# Patient Record
Sex: Male | Born: 1965 | Race: Asian | Hispanic: No | Marital: Married | Smoking: Never smoker
Health system: Southern US, Community
[De-identification: ages and names within clinical notes are randomized; demographics above are authoritative.]

---

## 2015-06-26 ENCOUNTER — Ambulatory Visit (INDEPENDENT_AMBULATORY_CARE_PROVIDER_SITE_OTHER): Payer: BLUE CROSS/BLUE SHIELD | Admitting: Family Medicine

## 2015-06-26 VITALS — BP 120/84 | HR 115 | Temp 99.1°F | Resp 20 | Ht 67.0 in | Wt 186.5 lb

## 2015-06-26 DIAGNOSIS — J111 Influenza due to unidentified influenza virus with other respiratory manifestations: Secondary | ICD-10-CM | POA: Diagnosis not present

## 2015-06-26 DIAGNOSIS — R0789 Other chest pain: Secondary | ICD-10-CM

## 2015-06-26 DIAGNOSIS — R590 Localized enlarged lymph nodes: Secondary | ICD-10-CM

## 2015-06-26 LAB — POCT CBC
GRANULOCYTE PERCENT: 68.9 % (ref 37–80)
HCT, POC: 39.9 % — AB (ref 43.5–53.7)
HEMOGLOBIN: 13.9 g/dL — AB (ref 14.1–18.1)
Lymph, poc: 1.1 (ref 0.6–3.4)
MCH, POC: 27.9 pg (ref 27–31.2)
MCHC: 34.9 g/dL (ref 31.8–35.4)
MCV: 80 fL (ref 80–97)
MID (cbc): 0.1 (ref 0–0.9)
MPV: 6.8 fL (ref 0–99.8)
PLATELET COUNT, POC: 185 10*3/uL (ref 142–424)
POC Granulocyte: 2.7 (ref 2–6.9)
POC LYMPH %: 28.3 % (ref 10–50)
POC MID %: 2.8 %M (ref 0–12)
RBC: 4.99 M/uL (ref 4.69–6.13)
RDW, POC: 12.4 %
WBC: 3.9 10*3/uL — AB (ref 4.6–10.2)

## 2015-06-26 MED ORDER — ALBUTEROL SULFATE (2.5 MG/3ML) 0.083% IN NEBU
2.5000 mg | INHALATION_SOLUTION | Freq: Once | RESPIRATORY_TRACT | Status: AC
  Administered 2015-06-26: 2.5 mg via RESPIRATORY_TRACT

## 2015-06-26 MED ORDER — HYDROCOD POLST-CPM POLST ER 10-8 MG/5ML PO SUER: 5.0000 mL | Freq: Two times a day (BID) | ORAL | Status: DC | PRN

## 2015-06-26 NOTE — Progress Notes (Signed)
Subjective:    Patient ID: Tony Rogers, male    DOB: 1966/02/16, 50 y.o.   MRN: [id number] By signing my name below, I, Javier Docker, attest that this documentation has been prepared under the direction and in the presence of Norberto Sorenson, MD. Electronically Signed: Javier Docker, ER Scribe. 06/26/2015. 12:47 PM.  Chief Complaint  Patient presents with  . Chills  . Nasal Congestion  . Cough   HPI HPI Comments: Tony Rogers is a 50 y.o. male who presents to [facility] complaining of diaphoresis, fever, chills, fatigue, sore throat, rhinorrhea, cough productive of white sputum for the last four days. His cough keeps him up at night. He used dayquil at home without significant relief. He got a flu shot this year. He endorses some SOB. He has never used an inhaler in the past. He has no past allergy hx.    History reviewed. No pertinent past medical history.  No Known Allergies  No current outpatient prescriptions on file prior to visit.   No current facility-administered medications on file prior to visit.    Review of Systems  Constitutional: Positive for fever, chills, diaphoresis, activity change, appetite change and fatigue. Negative for unexpected weight change.  HENT: Positive for congestion, postnasal drip and rhinorrhea. Negative for ear pain, sinus pressure, sore throat, trouble swallowing and voice change.   Respiratory: Positive for cough and chest tightness. Negative for shortness of breath and wheezing.   Cardiovascular: Negative for chest pain.  Gastrointestinal: Positive for nausea. Negative for vomiting, abdominal pain, diarrhea and constipation.  Genitourinary: Negative for dysuria.  Musculoskeletal: Positive for myalgias, back pain and arthralgias. Negative for joint swelling, gait problem and neck stiffness.  Skin: Negative for rash.  Allergic/Immunologic: Negative for immunocompromised state.  Neurological: Positive for headaches. Negative for syncope.  Hematological:  Positive for adenopathy.  Psychiatric/Behavioral: Positive for sleep disturbance.       Objective:  BP 120/84 mmHg  Pulse 115  Temp(Src) 99.1 F (37.3 C) (Oral)  Resp 20  Ht  (1.702 m)  Wt 186 lb 8 oz (84.596 kg)  BMI 29.20 kg/m2  SpO2 96%  Physical Exam  Constitutional: He is oriented to person, place, and time. He appears well-developed and well-nourished. No distress.  HENT:  Head: Normocephalic and atraumatic.  TMs normal. Nasal erythema and friability. Oropharynx with erythema.   Eyes: Pupils are equal, round, and reactive to light.  Neck: Neck supple.  Firm submandibular and tonsillar lymph nodes. Positive anterior cervical adenopathy. Diaphoretic.   Cardiovascular: Regular rhythm and normal heart sounds.   No murmur heard. Tachycardic.  Pulmonary/Chest: Effort normal and breath sounds normal. No respiratory distress. He has no wheezes.  Good air movement. Lungs clear.   Musculoskeletal: Normal range of motion.  Lymphadenopathy:    He has cervical adenopathy.  Neurological: He is alert and oriented to person, place, and time. Coordination normal.  Skin: Skin is warm and dry. He is not diaphoretic.  Psychiatric: He has a normal mood and affect. His behavior is normal.  Nursing note and vitals reviewed.     Results for orders placed or performed in visit on 06/26/15  POCT CBC  Result Value Ref Range   WBC 3.9 (A) 4.6 - 10.2 K/uL   Lymph, poc 1.1 0.6 - 3.4   POC LYMPH PERCENT 28.3 10 - 50 %L   MID (cbc) 0.1 0 - 0.9   POC MID % 2.8 0 - 12 %M   POC Granulocyte 2.7 2 -  6.9   Granulocyte percent 68.9 37 - 80 %G   RBC 4.99 4.69 - 6.13 M/uL   Hemoglobin 13.9 (A) 14.1 - 18.1 g/dL   HCT, POC 16.139.9 (A) 09.643.5 - 53.7 %   MCV 80.0 80 - 97 fL   MCH, POC 27.9 27 - 31.2 pg   MCHC 34.9 31.8 - 35.4 g/dL   RDW, POC 04.512.4 %   Platelet Count, POC 185 142 - 424 K/uL   MPV 6.8 0 - 99.8 fL    Assessment & Plan:   1. Influenza   2. Cervical adenopathy - RTC in 2 wks for  recheck if not completely resolved.  3. Chest tightness - just with coughing - more c/o chest cong rather than tightness. RTC immed if worsens. No improvement in sxs after albuterol neb.    Orders Placed This Encounter  Procedures  . POCT CBC    Meds ordered this encounter  Medications  . albuterol (PROVENTIL) (2.5 MG/3ML) 0.083% nebulizer solution 2.5 mg    Sig:   . chlorpheniramine-HYDROcodone (TUSSIONEX PENNKINETIC ER) 10-8 MG/5ML SUER    Sig: Take 5 mLs by mouth every 12 (twelve) hours as needed.    Dispense:  120 mL    Refill:  0    I personally performed the services described in this documentation, which was scribed in my presence. The recorded information has been reviewed and considered, and addended by me as needed.  Norberto SorensonEva Yancey Pedley, MD MPH

## 2015-06-26 NOTE — Patient Instructions (Addendum)
   IF you received an x-ray today, you will receive an invoice from [facility]. Please contact [facility] at [phone] with questions or concerns regarding your invoice.   IF you received labwork today, you will receive an invoice from [facility]. Please contact [facility] at [phone] with questions or concerns regarding your invoice.   Our billing staff will not be able to assist you with questions regarding bills from these companies.  You will be contacted with the lab results as soon as they are available. The fastest way to get your results is to activate your My Chart account. Instructions are located on the last page of this paperwork. If you have not heard from us regarding the results in 2 weeks, please contact this office.     Influenza, Adult Influenza ("the flu") is a viral infection of the respiratory tract. It occurs more often in winter months because people spend more time in close contact with one another. Influenza can make you feel very sick. Influenza easily spreads from person to person (contagious). CAUSES  Influenza is caused by a virus that infects the respiratory tract. You can catch the virus by breathing in droplets from an infected person's cough or sneeze. You can also catch the virus by touching something that was recently contaminated with the virus and then touching your mouth, nose, or eyes. RISKS AND COMPLICATIONS You may be at risk for a more severe case of influenza if you smoke cigarettes, have diabetes, have chronic heart disease (such as heart failure) or lung disease (such as asthma), or if you have a weakened immune system. Elderly people and pregnant women are also at risk for more serious infections. The most common problem of influenza is a lung infection (pneumonia). Sometimes, this problem can require emergency medical care and may be life threatening. SIGNS AND SYMPTOMS  Symptoms typically last 4  to 10 days and may include:  Fever.  Chills.  Headache, body aches, and muscle aches.  Sore throat.  Chest discomfort and cough.  Poor appetite.  Weakness or feeling tired.  Dizziness.  Nausea or vomiting. DIAGNOSIS  Diagnosis of influenza is often made based on your history and a physical exam. A nose or throat swab test can be done to confirm the diagnosis. TREATMENT  In mild cases, influenza goes away on its own. Treatment is directed at relieving symptoms. For more severe cases, your health care provider may prescribe antiviral medicines to shorten the sickness. Antibiotic medicines are not effective because the infection is caused by a virus, not by bacteria. HOME CARE INSTRUCTIONS  Take medicines only as directed by your health care provider.  Use a cool mist humidifier to make breathing easier.  Get plenty of rest until your temperature returns to normal. This usually takes 3 to 4 days.  Drink enough fluid to keep your urine clear or pale yellow.  Cover yourmouth and nosewhen coughing or sneezing,and wash your handswellto prevent thevirusfrom spreading.  Stay homefromwork orschool untilthe fever is gonefor at least 1full day. PREVENTION  An annual influenza vaccination (flu shot) is the best way to avoid getting influenza. An annual flu shot is now routinely recommended for all adults in the [place] SEEK MEDICAL CARE IF:  You experiencechest pain, yourcough worsens,or you producemore mucus.  Youhave nausea,vomiting, ordiarrhea.  Your fever returns or gets worse. SEEK IMMEDIATE MEDICAL CARE IF:  You havetrouble breathing, you become short of breath,or your skin ornails becomebluish.  You have severe painor   stiffnessin the neck.  You develop a sudden headache, or pain in the face or ear.  You have nausea or vomiting that you cannot control. MAKE SURE YOU:   Understand these instructions.  Will watch your condition.  Will get help  right away if you are not doing well or get worse.   This information is not intended to replace advice given to you by your health care provider. Make sure you discuss any questions you have with your health care provider.   Document Released: 03/09/2000 Document Revised: 04/02/2014 Document Reviewed: 06/11/2011 Elsevier Interactive Patient Education 2016 Elsevier Inc.  

## 2016-03-05 ENCOUNTER — Ambulatory Visit (INDEPENDENT_AMBULATORY_CARE_PROVIDER_SITE_OTHER): Payer: BLUE CROSS/BLUE SHIELD

## 2016-03-05 ENCOUNTER — Ambulatory Visit (INDEPENDENT_AMBULATORY_CARE_PROVIDER_SITE_OTHER): Payer: BLUE CROSS/BLUE SHIELD | Admitting: Family Medicine

## 2016-03-05 VITALS — BP 114/74 | HR 116 | Temp 99.5°F | Resp 17 | Ht 67.0 in | Wt 186.0 lb

## 2016-03-05 DIAGNOSIS — R5081 Fever presenting with conditions classified elsewhere: Secondary | ICD-10-CM | POA: Diagnosis not present

## 2016-03-05 DIAGNOSIS — R05 Cough: Secondary | ICD-10-CM

## 2016-03-05 DIAGNOSIS — J029 Acute pharyngitis, unspecified: Secondary | ICD-10-CM | POA: Diagnosis not present

## 2016-03-05 DIAGNOSIS — R059 Cough, unspecified: Secondary | ICD-10-CM

## 2016-03-05 LAB — POCT INFLUENZA A/B
INFLUENZA A, POC: NEGATIVE
INFLUENZA B, POC: NEGATIVE

## 2016-03-05 LAB — POCT RAPID STREP A (OFFICE): RAPID STREP A SCREEN: NEGATIVE

## 2016-03-05 MED ORDER — HYDROCODONE-HOMATROPINE 5-1.5 MG/5ML PO SYRP: ORAL_SOLUTION | ORAL | 0 refills | Status: AC

## 2016-03-05 NOTE — Patient Instructions (Addendum)
Chest xray ok. Flu test and strep tests were normal, but symptoms could still be due to the flu.  I will check a throat culture, but I suspect symptoms are due to a virus. Drink plenty of fluids, Mucinex if needed for cough, Cepacol lozenges if needed for sore throat, cough syrup at night if needed. If not improving in 2-3 days, or any worsening sooner  - return for recheck.   Return to the clinic or go to the nearest emergency room if any of your symptoms worsen or new symptoms occur.   Cough, Adult Coughing is a reflex that clears your throat and your airways. Coughing helps to heal and protect your lungs. It is normal to cough occasionally, but a cough that happens with other symptoms or lasts a long time may be a sign of a condition that needs treatment. A cough may last only 2-3 weeks (acute), or it may last longer than 8 weeks (chronic). What are the causes? Coughing is commonly caused by:  Breathing in substances that irritate your lungs.  A viral or bacterial respiratory infection.  Allergies.  Asthma.  Postnasal drip.  Smoking.  Acid backing up from the stomach into the esophagus (gastroesophageal reflux).  Certain medicines.  Chronic lung problems, including COPD (or rarely, lung cancer).  Other medical conditions such as heart failure. Follow these instructions at home: Pay attention to any changes in your symptoms. Take these actions to help with your discomfort:  Take medicines only as told by your health care provider.  If you were prescribed an antibiotic medicine, take it as told by your health care provider. Do not stop taking the antibiotic even if you start to feel better.  Talk with your health care provider before you take a cough suppressant medicine.  Drink enough fluid to keep your urine clear or pale yellow.  If the air is dry, use a cold steam vaporizer or humidifier in your bedroom or your home to help loosen secretions.  Avoid anything that causes  you to cough at work or at home.  If your cough is worse at night, try sleeping in a semi-upright position.  Avoid cigarette smoke. If you smoke, quit smoking. If you need help quitting, ask your health care provider.  Avoid caffeine.  Avoid alcohol.  Rest as needed. Contact a health care provider if:  You have new symptoms.  You cough up pus.  Your cough does not get better after 2-3 weeks, or your cough gets worse.  You cannot control your cough with suppressant medicines and you are losing sleep.  You develop pain that is getting worse or pain that is not controlled with pain medicines.  You have a fever.  You have unexplained weight loss.  You have night sweats. Get help right away if:  You cough up blood.  You have difficulty breathing.  Your heartbeat is very fast. This information is not intended to replace advice given to you by your health care provider. Make sure you discuss any questions you have with your health care provider. Document Released: 09/08/2010 Document Revised: 08/18/2015 Document Reviewed: 05/19/2014 Elsevier Interactive Patient Education  2017 [employer]   IF you received an x-ray today, you will receive an invoice from [facility]. Please contact [facility] at [phone] with questions or concerns regarding your invoice.   IF you received labwork today, you will receive an invoice from [facility]. Please contact [facility] at [phone] with questions or concerns regarding your  invoice.   Our billing staff will not be able to assist you with questions regarding bills from these companies.  You will be contacted with the lab results as soon as they are available. The fastest way to get your results is to activate your My Chart account. Instructions are located on the last page of this paperwork. If you have not heard from [place] regarding the results in 2 weeks, please contact this office.

## 2016-03-05 NOTE — Progress Notes (Signed)
Subjective:  By signing my name below, I, Tony Rogers, attest that this documentation has been prepared under the direction and in the presence of Tony StaggersJeffrey Zyair Russi, MD.  Electronically Signed: Andrew Auaven Rogers, ED Scribe. 03/05/2016. 2:40 PM.   Patient ID: Tony Rogers, male    DOB: Sep 12, 1965, 50 y.o.   MRN: [id number]  HPI   Chief Complaint  Patient presents with  . Cough    Productive, x3days. sore throat     HPI Comments: Tony Rogers is a 50 y.o. male who presents to the [facility] complaining of productive cough onset 3 days. Pt reports associated chills, fatigue, and sore throat. Pt tried Dayquil, nyquil and advil without relief. 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  Pt has had flu shot this year.    There are no active problems to display for this patient.  No past medical history on file. No past surgical history on file. No Known Allergies Prior to Admission medications   Medication Sig Start Date End Date Taking? Authorizing Provider  DM-Doxylamine-Acetaminophen (NYQUIL COLD & FLU PO) Take by mouth.   Yes Historical Provider, MD   Social History   Social History  . Marital status: Married    Spouse name: N/A  . Number of children: N/A  . Years of education: N/A   Occupational History  . Not on file.   Social History Main Topics  . Smoking status: Never Smoker  . Smokeless tobacco: Never Used  . Alcohol use No  . Drug use: No  . Sexual activity: Not on file   Other Topics Concern  . Not on file   Social History Narrative  . No narrative on file   Review of Systems  Constitutional: Positive for chills and fatigue. Negative for fever.  HENT: Positive for sore throat.   Respiratory: Positive for cough.        Objective:   Physical Exam  Constitutional: He is oriented to person, place, and time. He appears well-developed and well-nourished. No distress.  HENT:  Head: Normocephalic and atraumatic.  Mouth/Throat:  Posterior oropharyngeal erythema ( minimal) present. No oropharyngeal exudate.  Eyes: Conjunctivae and EOM are normal.  Neck: Neck supple.  Cardiovascular: Regular rhythm.  Tachycardia present.   Pulmonary/Chest: Effort normal.  Few coarse breath sounds left lower lobe  Musculoskeletal: Normal range of motion.  Lymphadenopathy:    He has no cervical adenopathy.  Neurological: He is alert and oriented to person, place, and time.  Skin: Skin is warm and dry.  Psychiatric: He has a normal mood and affect. His behavior is normal.  Nursing note and vitals reviewed.   Vitals:   03/05/16 1433  BP: 114/74  Pulse: (!) 116  Resp: 17  Temp: 99.5 F (37.5 C)  TempSrc: Oral  SpO2: 96%  Weight: 186 lb (84.4 kg)  Height: 5\' 7"  (1.702 m)    Results for orders placed or performed in visit on 03/05/16  POCT Influenza A/B  Result Value Ref Range   Influenza A, POC Negative Negative   Influenza B, POC Negative Negative  POCT rapid strep A  Result Value Ref Range   Rapid Strep A Screen Negative Negative   Dg Chest 2 View  Result Date: 03/05/2016 CLINICAL DATA:  50 year old with cough and fever. EXAM: CHEST  2 VIEW COMPARISON:  None. FINDINGS: Nodular density in the right lower chest probably represents a nipple shadow based on the lateral view. Lungs are clear  without airspace disease or pulmonary edema. Heart and mediastinum are within normal limits. Mild curvature in the thoracic spine. Mild endplate changes in thoracic spine. No pleural effusions. IMPRESSION: No active cardiopulmonary disease. Electronically Signed   By: Richarda Overlie M.D.   On: 03/05/2016 16:03    Assessment & Plan:    Tony Rogers is a 50 y.o. male Sore throat - Plan: POCT rapid strep A, Culture, Group A Strep Cough - Plan: POCT Influenza A/B, DG Chest 2 View, HYDROcodone-homatropine (HYCODAN) 5-1.5 MG/5ML syrup Fever in other diseases - Plan: DG Chest 2 View  - Low grade temp, cough, sore throat, viral symptoms with  negative flu and strep testing. Possible false-negative flu testing, but beyond 48 hours of symptoms. Chest x-ray without acute infiltrate. Symptomatic care discussed, out of work note given for next 48 hours, Hycodan cough syrup if needed for cough at night, side effects discussed. RTC precautions in 48-72 hours if not improving, sooner if worse. Understanding expressed.  Meds ordered this encounter  Medications  . DM-Doxylamine-Acetaminophen (NYQUIL COLD & FLU PO)    Sig: Take by mouth.  Marland Kitchen HYDROcodone-homatropine (HYCODAN) 5-1.5 MG/5ML syrup    Sig: 53m by mouth a bedtime as needed for cough.    Dispense:  120 mL    Refill:  0   Patient Instructions   Chest xray ok. Flu test and strep tests were normal, but symptoms could still be due to the flu.  I will check a throat culture, but I suspect symptoms are due to a virus. Drink plenty of fluids, Mucinex if needed for cough, Cepacol lozenges if needed for sore throat, cough syrup at night if needed. If not improving in 2-3 days, or any worsening sooner  - return for recheck.   Return to the clinic or go to the nearest emergency room if any of your symptoms worsen or new symptoms occur.   Cough, Adult Coughing is a reflex that clears your throat and your airways. Coughing helps to heal and protect your lungs. It is normal to cough occasionally, but a cough that happens with other symptoms or lasts a long time may be a sign of a condition that needs treatment. A cough may last only 2-3 weeks (acute), or it may last longer than 8 weeks (chronic). What are the causes? Coughing is commonly caused by:  Breathing in substances that irritate your lungs.  A viral or bacterial respiratory infection.  Allergies.  Asthma.  Postnasal drip.  Smoking.  Acid backing up from the stomach into the esophagus (gastroesophageal reflux).  Certain medicines.  Chronic lung problems, including COPD (or rarely, lung cancer).  Other medical conditions  such as heart failure. Follow these instructions at home: Pay attention to any changes in your symptoms. Take these actions to help with your discomfort:  Take medicines only as told by your health care provider.  If you were prescribed an antibiotic medicine, take it as told by your health care provider. Do not stop taking the antibiotic even if you start to feel better.  Talk with your health care provider before you take a cough suppressant medicine.  Drink enough fluid to keep your urine clear or pale yellow.  If the air is dry, use a cold steam vaporizer or humidifier in your bedroom or your home to help loosen secretions.  Avoid anything that causes you to cough at work or at home.  If your cough is worse at night, try sleeping in a semi-upright position.  Avoid cigarette smoke. If you smoke, quit smoking. If you need help quitting, ask your health care provider.  Avoid caffeine.  Avoid alcohol.  Rest as needed. Contact a health care provider if:  You have new symptoms.  You cough up pus.  Your cough does not get better after 2-3 weeks, or your cough gets worse.  You cannot control your cough with suppressant medicines and you are losing sleep.  You develop pain that is getting worse or pain that is not controlled with pain medicines.  You have a fever.  You have unexplained weight loss.  You have night sweats. Get help right away if:  You cough up blood.  You have difficulty breathing.  Your heartbeat is very fast. This information is not intended to replace advice given to you by your health care provider. Make sure you discuss any questions you have with your health care provider. Document Released: 09/08/2010 Document Revised: 08/18/2015 Document Reviewed: 05/19/2014 Elsevier Interactive Patient Education  2017 [employer]   IF you received an x-ray today, you will receive an invoice from [facility]. Please contact [facility] at  [phone] with questions or concerns regarding your invoice.   IF you received labwork today, you will receive an invoice from [facility]. Please contact [facility] at [phone] with questions or concerns regarding your invoice.   Our billing staff will not be able to assist you with questions regarding bills from these companies.  You will be contacted with the lab results as soon as they are available. The fastest way to get your results is to activate your My Chart account. Instructions are located on the last page of this paperwork. If you have not heard from us regarding the results in 2 weeks, please contact this office.        I personally performed the services described in this documentation, which was scribed in my presence. The recorded information has been reviewed and considered, and addended by me as needed.   Signed,   Tony StaggersJeffrey Terrence Pizana, MD [facility].  03/05/16 4:14 PM

## 2016-03-08 LAB — CULTURE, GROUP A STREP: Strep A Culture: NEGATIVE

## 2016-03-16 ENCOUNTER — Encounter: Payer: Self-pay | Admitting: *Deleted

## 2018-04-26 IMAGING — DX DG CHEST 2V
2 series · 2 of 2 positions shown · non-contrast
Comparison: None.

CLINICAL DATA: 50-year-old with cough and fever.

EXAM:
CHEST  2 VIEW

[chest pa]
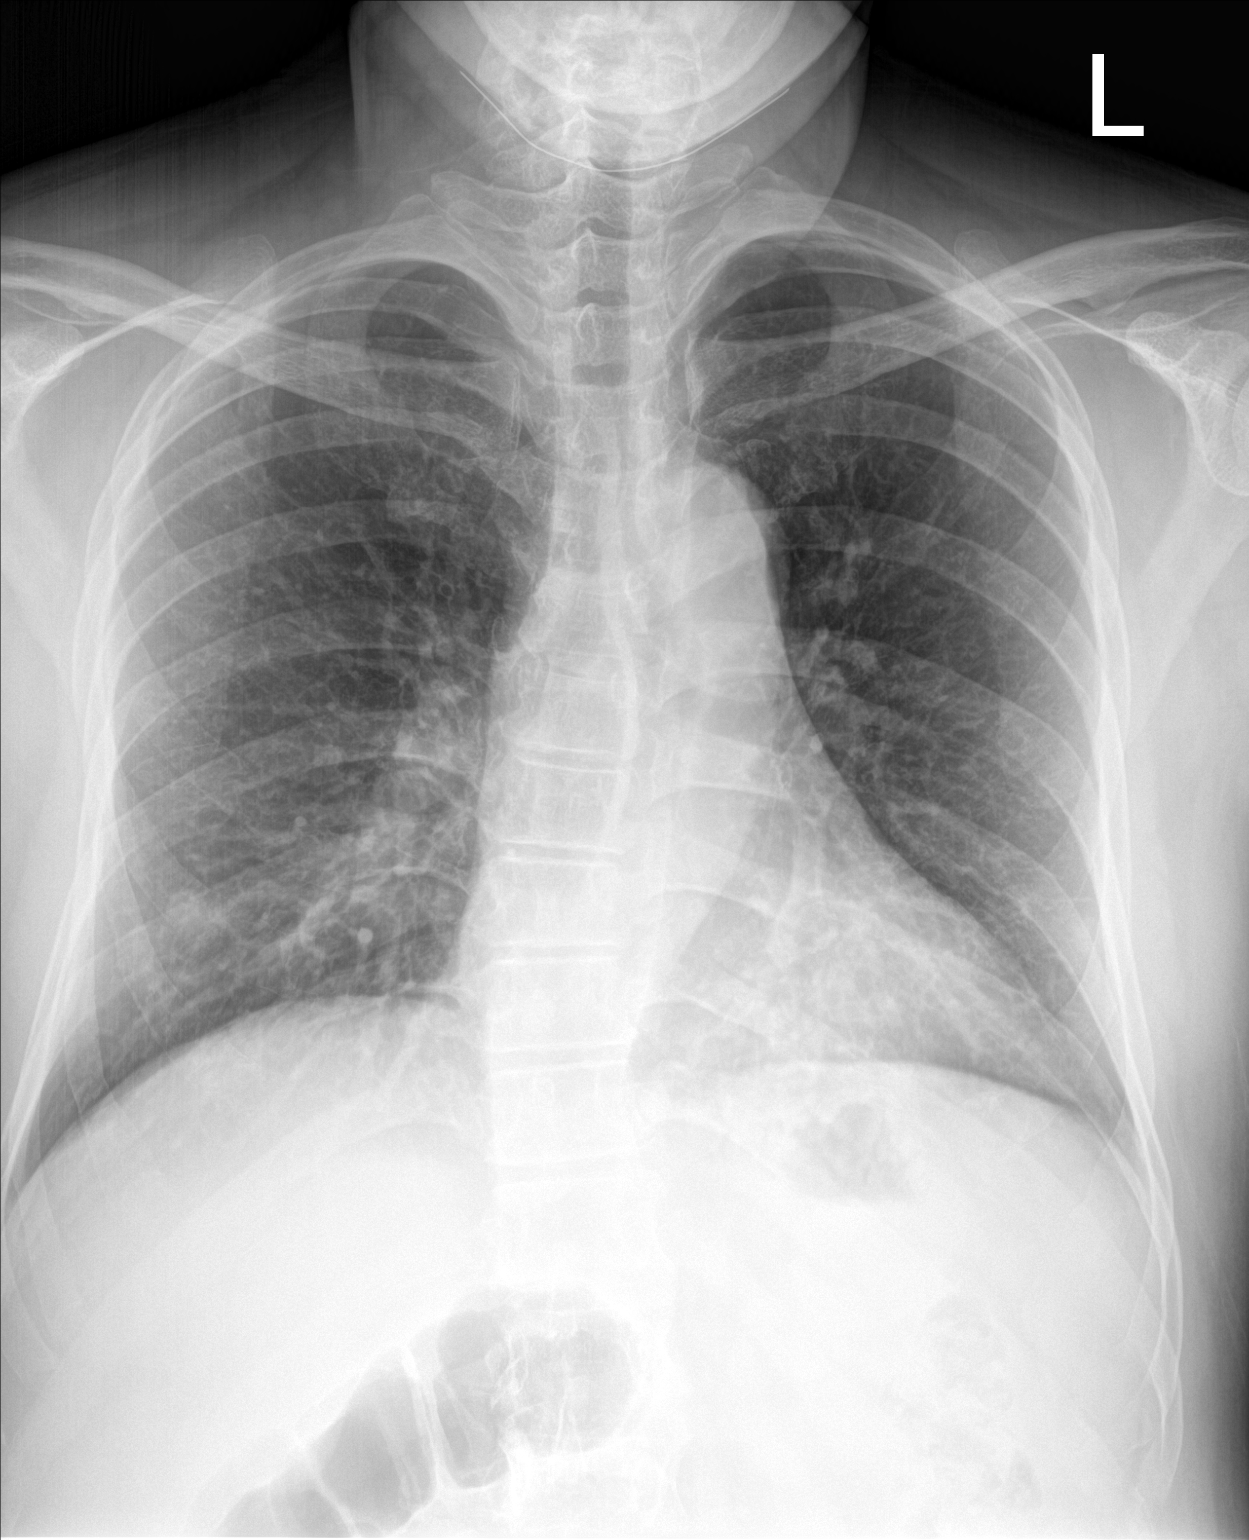

[chest lat]
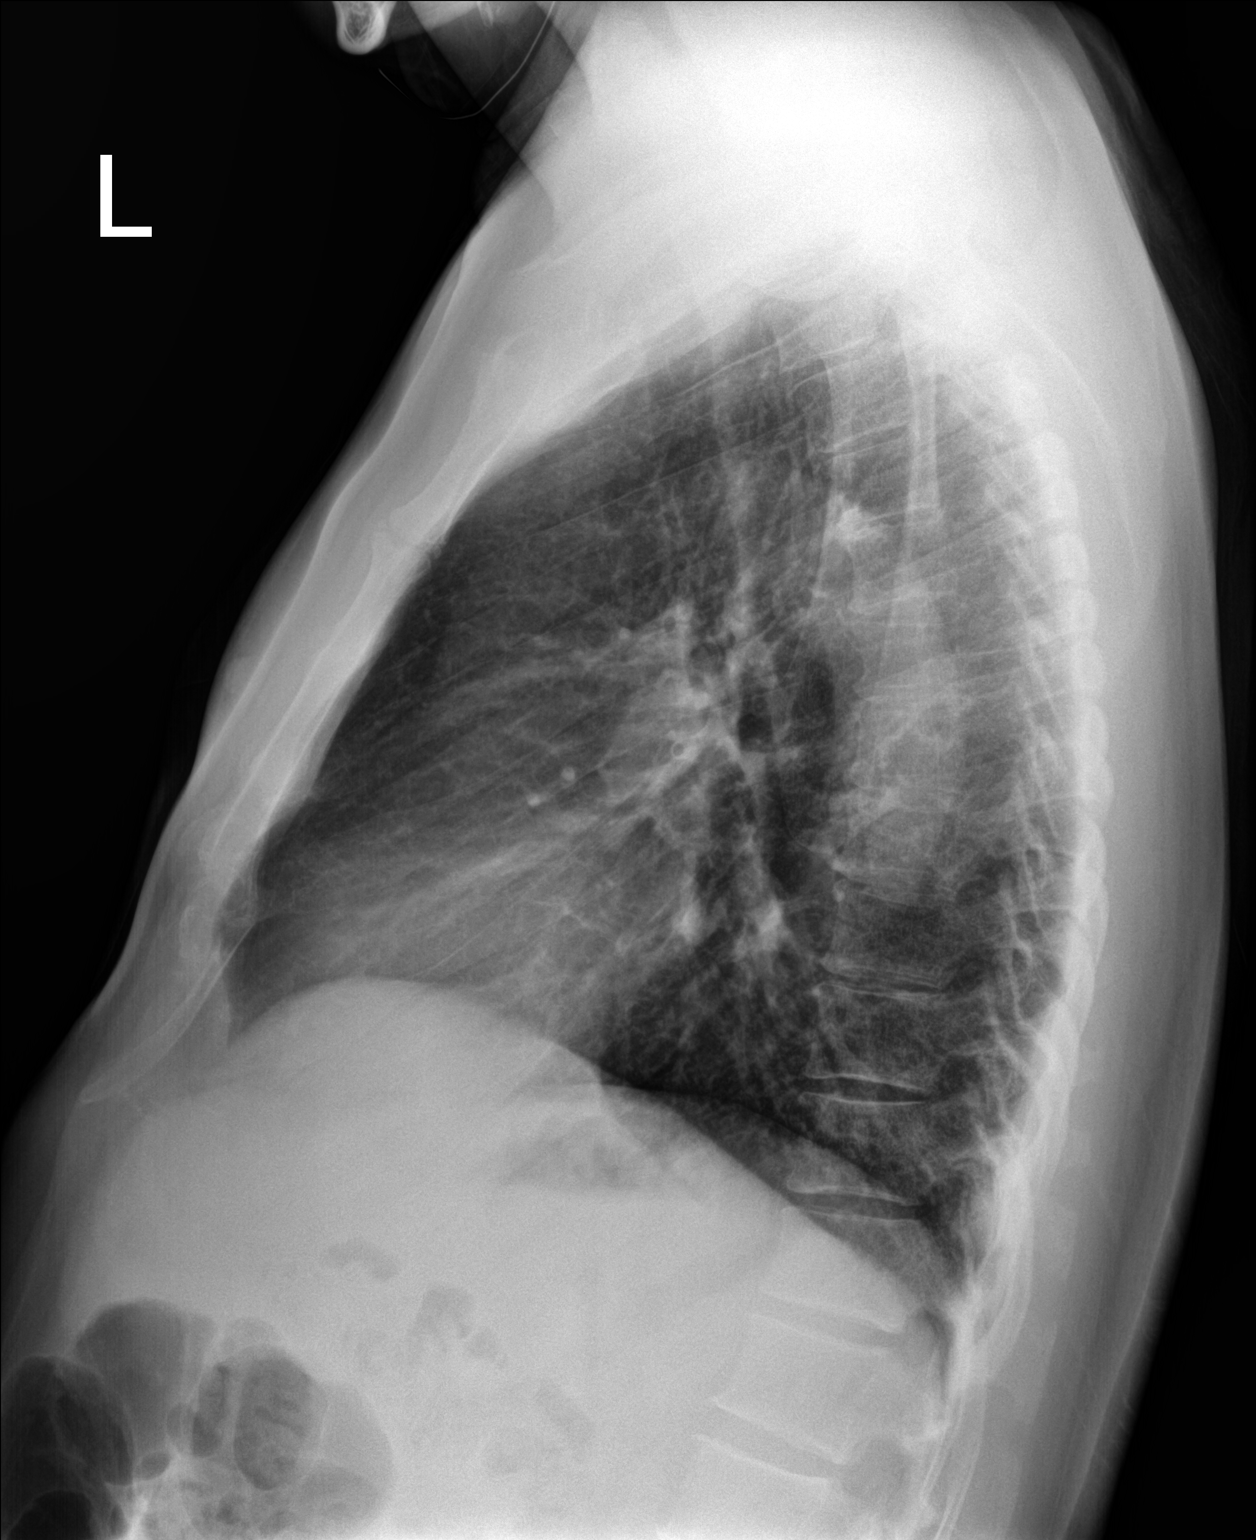

[2 of 2 positions shown; findings below may reference images not displayed]

FINDINGS: Nodular density in the right lower chest probably represents a
nipple shadow based on the lateral view. Lungs are clear without
airspace disease or pulmonary edema. Heart and mediastinum are
within normal limits. Mild curvature in the thoracic spine. Mild
endplate changes in thoracic spine. No pleural effusions.
IMPRESSION: No active cardiopulmonary disease.

## 2019-04-27 DIAGNOSIS — E1169 Type 2 diabetes mellitus with other specified complication: Secondary | ICD-10-CM | POA: Diagnosis not present

## 2019-04-27 DIAGNOSIS — I10 Essential (primary) hypertension: Secondary | ICD-10-CM | POA: Diagnosis not present

## 2019-04-27 DIAGNOSIS — E782 Mixed hyperlipidemia: Secondary | ICD-10-CM | POA: Diagnosis not present

## 2019-05-31 ENCOUNTER — Ambulatory Visit: Payer: Self-pay | Attending: Internal Medicine

## 2019-05-31 DIAGNOSIS — Z23 Encounter for immunization: Secondary | ICD-10-CM | POA: Insufficient documentation

## 2019-05-31 NOTE — Progress Notes (Signed)
   Covid-19 Vaccination Clinic  Name:  Tony Rogers    MRN: [id number] DOB: 1965-08-12  05/31/2019  [name] was observed post Covid-19 immunization for 15 minutes without incident. He was provided with Vaccine Information Sheet and instruction to access the V-Safe system.   [name] was instructed to call 911 with any severe reactions post vaccine: Marland Kitchen Difficulty breathing  . Swelling of face and throat  . A fast heartbeat  . A bad rash all over body  . Dizziness and weakness   Immunizations Administered    Name Date Dose VIS Date Route   Pfizer COVID-19 Vaccine 05/31/2019  5:29 PM 0.3 mL 03/06/2019 Intramuscular   Manufacturer: [employer], [employer]   Lot: [id number]   NDC: [address]-0

## 2019-07-01 ENCOUNTER — Ambulatory Visit: Payer: Self-pay | Attending: Neurology

## 2019-07-01 DIAGNOSIS — Z23 Encounter for immunization: Secondary | ICD-10-CM

## 2019-07-01 NOTE — Progress Notes (Signed)
   Covid-19 Vaccination Clinic  Name:  Tony Rogers    MRN: [id number] DOB: [birth year]  07/01/2019  Tony Rogers was observed post Covid-19 immunization for 15 minutes without incident. He was provided with Vaccine Information Sheet and instruction to access the V-Safe system.   Tony Rogers was instructed to call 911 with any severe reactions post vaccine: Marland Kitchen Difficulty breathing  . Swelling of face and throat  . A fast heartbeat  . A bad rash all over body  . Dizziness and weakness   Immunizations Administered    Name Date Dose VIS Date Route   Pfizer COVID-19 Vaccine 07/01/2019  3:39 PM 0.3 mL 03/06/2019 Intramuscular   Manufacturer: [employer], [employer]   Lot: [id number]   NDC: [address]-7

## 2019-07-13 DIAGNOSIS — Z0000 Encounter for general adult medical examination without abnormal findings: Secondary | ICD-10-CM | POA: Diagnosis not present

## 2020-01-11 DIAGNOSIS — Z23 Encounter for immunization: Secondary | ICD-10-CM | POA: Diagnosis not present

## 2020-01-11 DIAGNOSIS — I10 Essential (primary) hypertension: Secondary | ICD-10-CM | POA: Diagnosis not present

## 2020-01-11 DIAGNOSIS — E782 Mixed hyperlipidemia: Secondary | ICD-10-CM | POA: Diagnosis not present

## 2020-01-11 DIAGNOSIS — E1169 Type 2 diabetes mellitus with other specified complication: Secondary | ICD-10-CM | POA: Diagnosis not present

## 2020-03-22 ENCOUNTER — Encounter: Payer: Self-pay | Admitting: Endocrinology

## 2021-05-12 DIAGNOSIS — E1169 Type 2 diabetes mellitus with other specified complication: Secondary | ICD-10-CM | POA: Diagnosis not present

## 2021-05-12 DIAGNOSIS — I10 Essential (primary) hypertension: Secondary | ICD-10-CM | POA: Diagnosis not present

## 2021-05-12 DIAGNOSIS — E782 Mixed hyperlipidemia: Secondary | ICD-10-CM | POA: Diagnosis not present
